# Patient Record
Sex: Female | Born: 1960 | Race: White | Hispanic: No | Marital: Single | Smoking: Never smoker
Health system: Southern US, Community
[De-identification: ages and names within clinical notes are randomized; demographics above are authoritative.]

---

## 2016-07-25 ENCOUNTER — Ambulatory Visit (INDEPENDENT_AMBULATORY_CARE_PROVIDER_SITE_OTHER): Payer: Medicare Other

## 2016-07-25 ENCOUNTER — Ambulatory Visit (INDEPENDENT_AMBULATORY_CARE_PROVIDER_SITE_OTHER): Payer: Medicare Other | Admitting: Family Medicine

## 2016-07-25 ENCOUNTER — Encounter: Payer: Self-pay | Admitting: Family Medicine

## 2016-07-25 VITALS — BP 121/70 | HR 81 | Temp 98.1°F | Resp 16 | Ht 62.0 in | Wt 174.2 lb

## 2016-07-25 DIAGNOSIS — M65312 Trigger thumb, left thumb: Secondary | ICD-10-CM | POA: Diagnosis not present

## 2016-07-25 DIAGNOSIS — M79645 Pain in left finger(s): Secondary | ICD-10-CM | POA: Diagnosis not present
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
## 2016-07-25 NOTE — Patient Instructions (Addendum)
X-ray did not indicate any fracture or concerns of the bones of your thumb. However it does appear that you may have some component of trigger thumb but also discomfort at the top of the first thumb joint.  I would recommend avoiding repetitive grasping of that thumb to allow it to rest, and use brace for next 1-2 weeks only. Come out of that brace at least once per day to move wrist so that wrist does not get stiff or weak.   You may need to meet with a hand specialist to determine if injections or other treatments are needed if that pain does not improve with rest over the next two weeks. Let me know or return for recheck in 2 weeks if pain not resolved.   Trigger Finger Trigger finger (stenosing tenosynovitis) is a condition that causes a finger to get stuck in a bent position. Each finger has a tough, cord-like tissue that connects muscle to bone (tendon), and each tendon is surrounded by a tunnel of tissue (tendon sheath). To move your finger, your tendon needs to slide freely through the sheath. Trigger finger happens when the tendon or the sheath thickens, making it difficult to move your finger. Trigger finger can affect any finger or a thumb. It may affect more than one finger. Mild cases may clear up with rest and medicine. Severe cases require more treatment. What are the causes? Trigger finger is caused by a thickened finger tendon or tendon sheath. The cause of this thickening is not known. What increases the risk? The following factors may make you more likely to develop this condition:  Doing activities that require a strong grip.  Having rheumatoid arthritis, gout, or diabetes.  Being 60-76 years old.  Being a woman. What are the signs or symptoms? Symptoms of this condition include:  Pain when bending or straightening your finger.  Tenderness or swelling where your finger attaches to the palm of your hand.  A lump in the palm of your hand or on the inside of your  finger.  Hearing a popping sound when you try to straighten your finger.  Feeling a popping, catching, or locking sensation when you try to straighten your finger.  Being unable to straighten your finger. How is this diagnosed? This condition is diagnosed based on your symptoms and a physical exam. How is this treated? This condition may be treated by:  Resting your finger and avoiding activities that make symptoms worse.  Wearing a finger splint to keep your finger in a slightly bent position.  Taking NSAIDs to relieve pain and swelling.  Injecting medicine (steroids) into the tendon sheath to reduce swelling and irritation. Injections may need to be repeated.  Having surgery to open the tendon sheath. This may be done if other treatments do not work and you cannot straighten your finger. You may need physical therapy after surgery. Follow these instructions at home:  Use moist heat to help reduce pain and swelling as told by your health care provider.  Rest your finger and avoid activities that make pain worse. Return to normal activities as told by your health care provider.  If you have a splint, wear it as told by your health care provider.  Take over-the-counter and prescription medicines only as told by your health care provider.  Keep all follow-up visits as told by your health care provider. This is important. Contact a health care provider if:  Your symptoms are not improving with home care. Summary  Trigger  finger (stenosing tenosynovitis) causes your finger to get stuck in a bent position, and it can make it difficult and painful to straighten your finger.  This condition develops when a finger tendon or tendon sheath thickens.  Treatment starts with resting, wearing a splint, and taking NSAIDs.  In severe cases, surgery to open the tendon sheath may be needed. This information is not intended to replace advice given to you by your health care provider. Make  sure you discuss any questions you have with your health care provider. Document Released: 12/12/2003 Document Revised: 02/02/2016 Document Reviewed: 02/02/2016 Elsevier Interactive Patient Education  2017 [employer]   IF you received an x-ray today, you will receive an invoice from [facility]. Please contact [facility] at [phone] with questions or concerns regarding your invoice.   IF you received labwork today, you will receive an invoice from [facility]. Please contact [facility] at [phone] with questions or concerns regarding your invoice.   Our billing staff will not be able to assist you with questions regarding bills from these companies.  You will be contacted with the lab results as soon as they are available. The fastest way to get your results is to activate your My Chart account. Instructions are located on the last page of this paperwork. If you have not heard from us regarding the results in 2 weeks, please contact this office.

## 2016-08-08 ENCOUNTER — Ambulatory Visit (INDEPENDENT_AMBULATORY_CARE_PROVIDER_SITE_OTHER): Payer: Medicare Other | Admitting: Family Medicine

## 2016-08-08 ENCOUNTER — Encounter: Payer: Self-pay | Admitting: Family Medicine

## 2016-08-08 VITALS — BP 130/69 | HR 69 | Temp 97.4°F | Resp 16 | Ht 62.0 in | Wt 174.2 lb

## 2016-08-08 DIAGNOSIS — M79645 Pain in left finger(s): Secondary | ICD-10-CM

## 2016-08-08 NOTE — Progress Notes (Signed)
Subjective:  This chart was scribed for Shade Flood, MD by Veverly Fells, at Primary Care at [facility].  This patient was seen in room 10 and the patient's care was started at 12:51 PM.   Chief Complaint  Patient presents with  . Follow-up    2week recheck on left thumb     Patient ID: Barbara Nash, female    DOB: February 02, 1961, 56 y.o.   MRN: [id number]  HPI HPI Comments: Barbara Nash is a 56 y.o. female who presents to Primary Care at [facility] for a follow up. Patient had left thumb pain on May 21st.  She had been having some pain with using her hands for gripping with a new job she was applying to.  Had been resting for a few days prior.  Suspected trigger thumb of left hand.  She was placed in a thumb spica splint with range of motion of wrist QD. Here for a follow up. --- Patient states that her thumb is still "popping" last ocurred yesterday- while holding the steering wheel .  She has been compliant with keeping her brace on and doing some range of motion exercises.  She is currently taking 500 mg Tylenol every morning.  She has been icing it daily.    There are no active problems to display for this patient.  History reviewed. No pertinent past medical history. History reviewed. No pertinent surgical history. Allergies  Allergen Reactions  . Penicillins Hives   Prior to Admission medications   Medication Sig Start Date End Date Taking? Authorizing Provider  aspirin EC 81 MG tablet Take 81 mg by mouth daily.    [provider]   Social History   Social History  . Marital status: Single    Spouse name: N/A  . Number of children: N/A  . Years of education: N/A   Occupational History  . Not on file.   Social History Main Topics  . Smoking status: Never Smoker  . Smokeless tobacco: Never Used  . Alcohol use No  . Drug use: No  . Sexual activity: Not on file   Other Topics Concern  . Not on file   Social History Narrative  . No narrative on file     Review of Systems  Musculoskeletal: Positive for arthralgias and joint swelling.  Skin: Positive for color change. Negative for rash.       Objective:   Physical Exam  Constitutional: She is oriented to person, place, and time. She appears well-developed and well-nourished. No distress.  HENT:  Head: Normocephalic and atraumatic.  Eyes: Conjunctivae and EOM are normal.  Neck: Neck supple. No tracheal deviation present.  Cardiovascular: Normal rate.   Cap refill less than 1 second  Pulmonary/Chest: Effort normal. No respiratory distress.  Musculoskeletal: Normal range of motion.  Left hand: She has no apparent rash.  Some prominence of the left thumb IP joint.  No focal tenderness but does have some dorsal discomfort and decreased motion with flexion of the thumb, able to extend fully. Flexion strength is intact. Pain was at dorsal aspect of her thumb MCP. She has equal range of motion in her wrists.   Neurological: She is alert and oriented to person, place, and time.  sensation intact at tip of the left thumb.   Skin: Skin is warm and dry.  Psychiatric: She has a normal mood and affect. Her behavior is normal.  Nursing note and vitals reviewed.  Vitals:   08/08/16 1214  BP: 130/69  Pulse: 69  Resp: 16  Temp: 97.4 F (36.3 C)  TempSrc: Oral  SpO2: 99%  Weight: 174 lb 3.2 oz (79 kg)  Height: 5\' 2"  (1.575 m)       Assessment & Plan:  Barbara Nash is a 56 y.o. female Pain of left thumb - Plan: Ambulatory referral to Hand Surgery  - Persistent pain and reported locking of thumb. Trigger thumb possible, but based on location of locking, could also be degenerative changes within her MCP joint. Bracing if needed, but will refer to hand surgery for further evaluation. Would avoid overuse or work with that thumb until cleared by orthopedist.  No orders of the defined types were placed in this encounter.  Patient Instructions   I will refer you to the hand specialist to  evaluate your thumb further. If any acute worsening before their visit, return for recheck. Okay to use brace if needed, but remove brace at least once per day for range of motion of hand and gentle range of motion at thumb as tolerated.   IF you received an x-ray today, you will receive an invoice from [facility]. Please contact [facility] at [phone] with questions or concerns regarding your invoice.   IF you received labwork today, you will receive an invoice from [facility]. Please contact [facility] at [phone] with questions or concerns regarding your invoice.   Our billing staff will not be able to assist you with questions regarding bills from these companies.  You will be contacted with the lab results as soon as they are available. The fastest way to get your results is to activate your My Chart account. Instructions are located on the last page of this paperwork. If you have not heard from us regarding the results in 2 weeks, please contact this office.       I personally performed the services described in this documentation, which was scribed in my presence. The recorded information has been reviewed and considered for accuracy and completeness, addended by me as needed, and agree with information above.  Signed,   Meredith StaggersJeffrey Brizeida Mcmurry, MD Primary Care at [facility].  08/10/16 4:13 PM

## 2016-08-08 NOTE — Patient Instructions (Addendum)
I will refer you to the hand specialist to evaluate your thumb further. If any acute worsening before their visit, return for recheck. Okay to use brace if needed, but remove brace at least once per day for range of motion of hand and gentle range of motion at thumb as tolerated.   IF you received an x-ray today, you will receive an invoice from [facility]. Please contact [facility] at [phone] with questions or concerns regarding your invoice.   IF you received labwork today, you will receive an invoice from [facility]. Please contact [facility] at [phone] with questions or concerns regarding your invoice.   Our billing staff will not be able to assist you with questions regarding bills from these companies.  You will be contacted with the lab results as soon as they are available. The fastest way to get your results is to activate your My Chart account. Instructions are located on the last page of this paperwork. If you have not heard from us regarding the results in 2 weeks, please contact this office.

## 2017-05-15 ENCOUNTER — Emergency Department (HOSPITAL_COMMUNITY)
Admission: EM | Admit: 2017-05-15 | Discharge: 2017-05-15 | Disposition: A | Discharge status: Home / Self Care | Payer: Medicare Other | Attending: Emergency Medicine | Admitting: Emergency Medicine

## 2017-05-15 ENCOUNTER — Other Ambulatory Visit: Payer: Self-pay

## 2017-05-15 ENCOUNTER — Encounter (HOSPITAL_COMMUNITY): Payer: Self-pay | Admitting: Emergency Medicine

## 2017-05-15 DIAGNOSIS — Z7982 Long term (current) use of aspirin: Secondary | ICD-10-CM | POA: Insufficient documentation

## 2017-05-15 DIAGNOSIS — H9193 Unspecified hearing loss, bilateral: Secondary | ICD-10-CM

## 2017-05-15 DIAGNOSIS — H9191 Unspecified hearing loss, right ear: Secondary | ICD-10-CM | POA: Diagnosis not present

## 2017-05-15 NOTE — ED Provider Notes (Signed)
  [facility]   CSN: [id number] Arrival date & time: 05/15/17  1112     History   Chief Complaint Chief Complaint  Patient presents with  . Hearing Problem    HPI Barbara Nash is a 57 y.o. female.  HPI Pt reports 2 weeks of hearing loss in her right ear. Reports URI symptoms 2 weeks ago. No fevers. No HA. No ear trauma. Reports the hearing in her right ear is improved if she puts her finger in her right ear and pulls down on it. No difficulty swallowing.    History reviewed. No pertinent past medical history.  There are no active problems to display for this patient.   History reviewed. No pertinent surgical history.  OB History    No data available       Home Medications    Prior to Admission medications   Medication Sig Start Date End Date Taking? Authorizing Provider  aspirin EC 81 MG tablet Take 81 mg by mouth daily.    [provider]    Family History Family History  Problem Relation Age of Onset  . Cancer Mother   . Stroke Father     Social History Social History   Tobacco Use  . Smoking status: Never Smoker  . Smokeless tobacco: Never Used  Substance Use Topics  . Alcohol use: No  . Drug use: No     Allergies   Penicillins   Review of Systems Review of Systems  All other systems reviewed and are negative.    Physical Exam Updated Vital Signs BP 128/81 (BP Location: Right Arm)   Pulse 73   Temp 97.9 F (36.6 C) (Oral)   Resp 18   SpO2 95%   Physical Exam  Constitutional: She is oriented to person, place, and time. She appears well-developed and well-nourished.  HENT:  Head: Normocephalic.  Bilateral TMs are fluid filled without surrounding erythema. No perforations noted  Eyes: EOM are normal.  Neck: Normal range of motion.  Pulmonary/Chest: Effort normal.  Abdominal: She exhibits no distension.  Musculoskeletal: Normal range of motion.  Neurological: She is  alert and oriented to person, place, and time.  Psychiatric: She has a normal mood and affect.  Nursing note and vitals reviewed.    ED Treatments / Results  Labs (all labs ordered are listed, but only abnormal results are displayed) Labs Reviewed - No data to display  EKG  EKG Interpretation None       Radiology No results found.  Procedures Procedures (including critical care time)  Medications Ordered in ED Medications - No data to display   Initial Impression / Assessment and Plan / ED Course  I have reviewed the triage vital signs and the nursing notes.  Pertinent labs & imaging results that were available during my care of the patient were reviewed by me and considered in my medical decision making (see chart for details).         Final Clinical Impressions(s) / ED Diagnoses   Final diagnoses:  Bilateral hearing loss, unspecified hearing loss type    ED Discharge Orders    None       Azalia Bilisampos, Anik Wesch, MD 05/15/17 1247

## 2017-05-15 NOTE — ED Triage Notes (Signed)
Pt here from [facility] with c/o hearing loss , pt had her ears cleaned out at there , pt used  her friends hearing aid and could hear better

## 2017-05-15 NOTE — Discharge Instructions (Signed)
Please follow up with an AUDIOLOGIST and an Ear Nose and Throat Specialist

## 2018-05-15 IMAGING — DX DG FINGER THUMB 2+V*L*
3 series · 3 of 3 positions shown · non-contrast
Comparison: None.

CLINICAL DATA: Left thumb pain at MCP joint

EXAM:
LEFT THUMB 2+V

[finger ap]
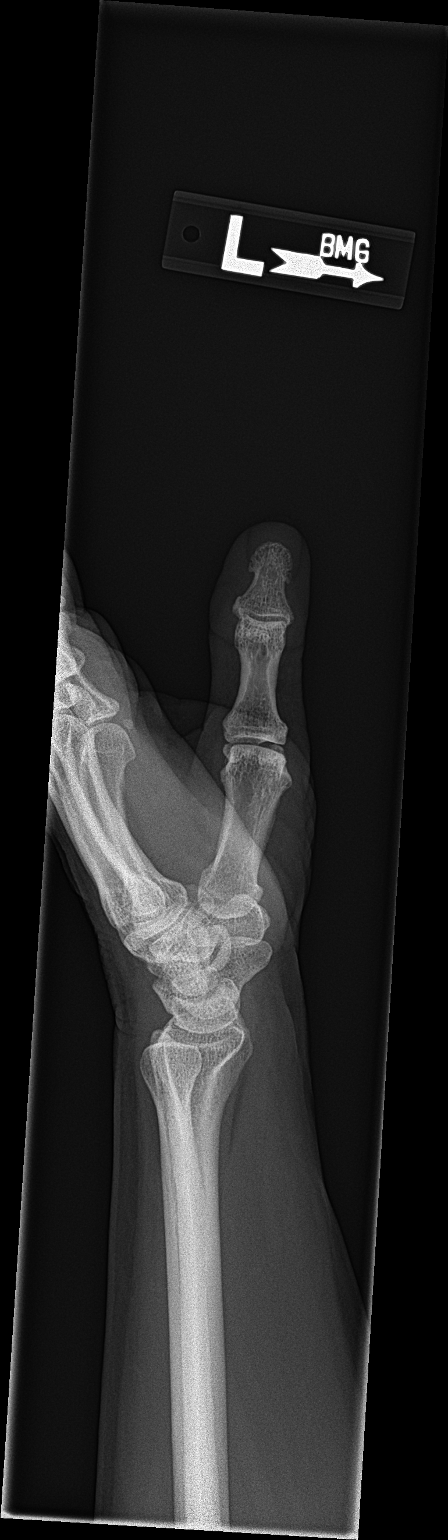

[finger obl]
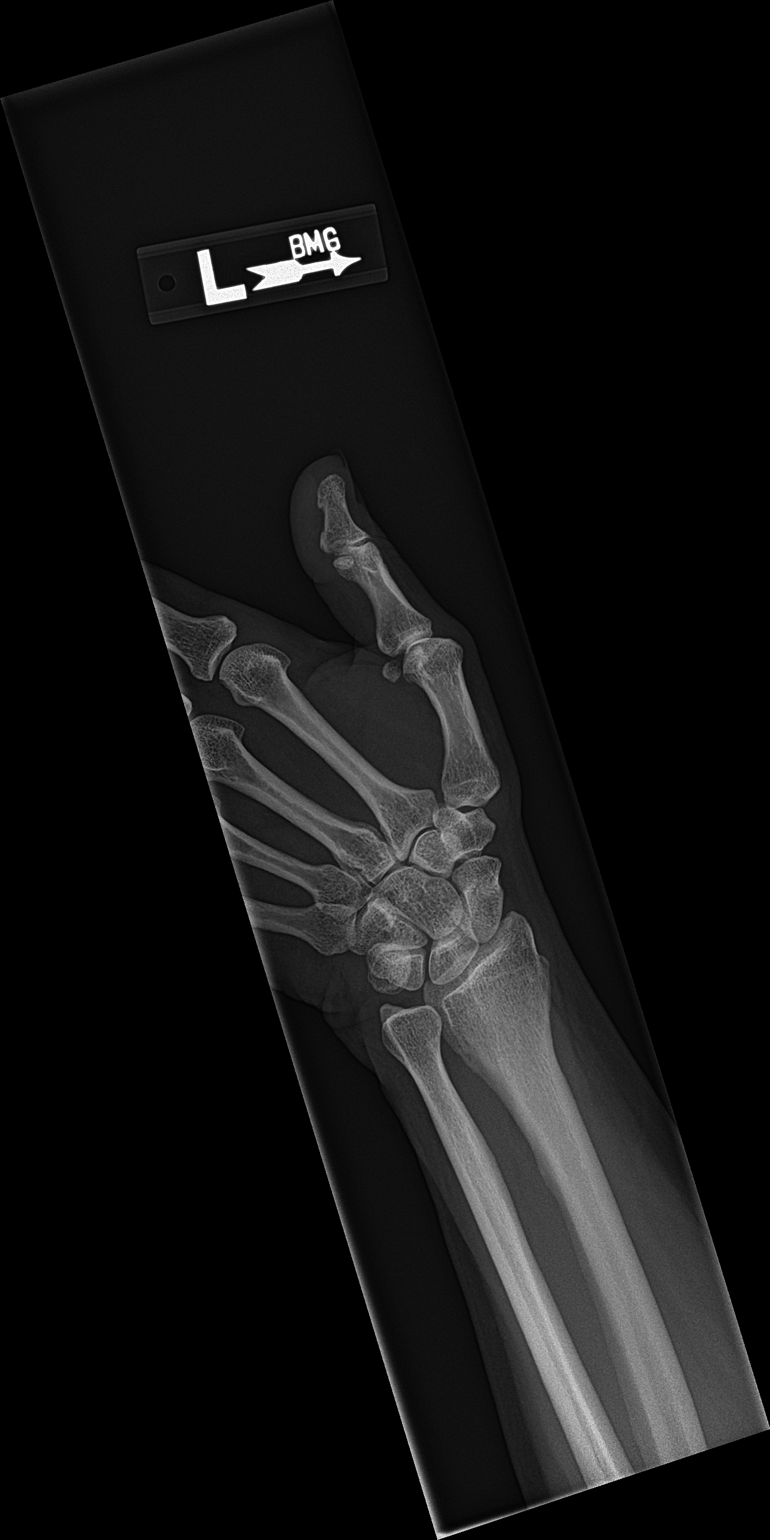

[finger lat]
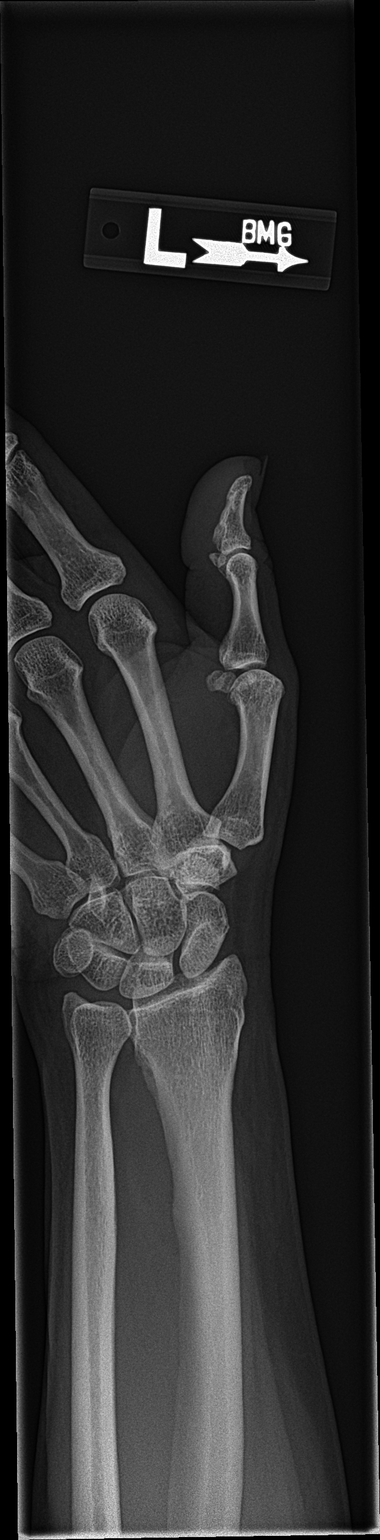

[3 of 3 positions shown; findings below may reference images not displayed]

FINDINGS: No fracture or dislocation is seen.

The joint spaces are preserved.

The visualized soft tissues are unremarkable.
IMPRESSION: Negative.
